# Patient Record
Sex: Male | Born: 1955 | Race: Black or African American | Hispanic: No | Marital: Single | Smoking: Never smoker
Health system: Southern US, Community
[De-identification: ages and names within clinical notes are randomized; demographics above are authoritative.]

## PROBLEM LIST (undated history)

## (undated) DIAGNOSIS — I10 Essential (primary) hypertension: Secondary | ICD-10-CM

## (undated) HISTORY — PX: OTHER SURGICAL HISTORY: SHX169

---

## 2005-11-26 ENCOUNTER — Emergency Department: Payer: Self-pay | Admitting: Emergency Medicine

## 2018-02-12 ENCOUNTER — Emergency Department: Payer: No Typology Code available for payment source

## 2018-02-12 ENCOUNTER — Observation Stay: Payer: No Typology Code available for payment source

## 2018-02-12 ENCOUNTER — Observation Stay
Admission: EM | Admit: 2018-02-12 | Discharge: 2018-02-13 | Disposition: A | Discharge status: Home / Self Care | Payer: No Typology Code available for payment source | Attending: Internal Medicine | Admitting: Internal Medicine

## 2018-02-12 ENCOUNTER — Encounter: Payer: Self-pay | Admitting: Emergency Medicine

## 2018-02-12 ENCOUNTER — Other Ambulatory Visit: Payer: Self-pay

## 2018-02-12 DIAGNOSIS — G459 Transient cerebral ischemic attack, unspecified: Secondary | ICD-10-CM

## 2018-02-12 DIAGNOSIS — R29700 NIHSS score 0: Secondary | ICD-10-CM | POA: Insufficient documentation

## 2018-02-12 DIAGNOSIS — Z7982 Long term (current) use of aspirin: Secondary | ICD-10-CM | POA: Insufficient documentation

## 2018-02-12 DIAGNOSIS — D170 Benign lipomatous neoplasm of skin and subcutaneous tissue of head, face and neck: Secondary | ICD-10-CM | POA: Diagnosis not present

## 2018-02-12 DIAGNOSIS — Z79899 Other long term (current) drug therapy: Secondary | ICD-10-CM | POA: Insufficient documentation

## 2018-02-12 DIAGNOSIS — Z9119 Patient's noncompliance with other medical treatment and regimen: Secondary | ICD-10-CM | POA: Insufficient documentation

## 2018-02-12 DIAGNOSIS — I6523 Occlusion and stenosis of bilateral carotid arteries: Secondary | ICD-10-CM | POA: Diagnosis not present

## 2018-02-12 DIAGNOSIS — I10 Essential (primary) hypertension: Secondary | ICD-10-CM | POA: Insufficient documentation

## 2018-02-12 DIAGNOSIS — R202 Paresthesia of skin: Secondary | ICD-10-CM | POA: Diagnosis present

## 2018-02-12 DIAGNOSIS — Z7902 Long term (current) use of antithrombotics/antiplatelets: Secondary | ICD-10-CM | POA: Insufficient documentation

## 2018-02-12 DIAGNOSIS — I639 Cerebral infarction, unspecified: Secondary | ICD-10-CM | POA: Diagnosis not present

## 2018-02-12 HISTORY — DX: Essential (primary) hypertension: I10

## 2018-02-12 LAB — DIFFERENTIAL
Basophils Absolute: 0 10*3/uL (ref 0–0.1)
Basophils Relative: 1 %
EOS ABS: 0.2 10*3/uL (ref 0–0.7)
EOS PCT: 2 %
Lymphocytes Relative: 25 %
Lymphs Abs: 1.8 10*3/uL (ref 1.0–3.6)
Monocytes Absolute: 0.6 10*3/uL (ref 0.2–1.0)
Monocytes Relative: 8 %
Neutro Abs: 4.8 10*3/uL (ref 1.4–6.5)
Neutrophils Relative %: 64 %

## 2018-02-12 LAB — CBC
HCT: 47.6 % (ref 40.0–52.0)
Hemoglobin: 16.2 g/dL (ref 13.0–18.0)
MCH: 30.6 pg (ref 26.0–34.0)
MCHC: 34.1 g/dL (ref 32.0–36.0)
MCV: 89.9 fL (ref 80.0–100.0)
PLATELETS: 164 10*3/uL (ref 150–440)
RBC: 5.3 MIL/uL (ref 4.40–5.90)
RDW: 12.9 % (ref 11.5–14.5)
WBC: 7.5 10*3/uL (ref 3.8–10.6)

## 2018-02-12 LAB — COMPREHENSIVE METABOLIC PANEL
ALT: 28 U/L (ref 0–44)
ANION GAP: 8 (ref 5–15)
AST: 32 U/L (ref 15–41)
Albumin: 4.6 g/dL (ref 3.5–5.0)
Alkaline Phosphatase: 69 U/L (ref 38–126)
BUN: 14 mg/dL (ref 8–23)
CHLORIDE: 103 mmol/L (ref 98–111)
CO2: 30 mmol/L (ref 22–32)
Calcium: 9.4 mg/dL (ref 8.9–10.3)
Creatinine, Ser: 1.13 mg/dL (ref 0.61–1.24)
GFR calc Af Amer: >60 mL/min (ref >60)
GFR calc non Af Amer: >60 mL/min (ref >60)
GLUCOSE: 115 mg/dL — AB (ref 70–99)
POTASSIUM: 3.9 mmol/L (ref 3.5–5.1)
SODIUM: 141 mmol/L (ref 135–145)
TOTAL PROTEIN: 8.4 g/dL — AB (ref 6.5–8.1)
Total Bilirubin: 1.6 mg/dL — ABNORMAL HIGH (ref 0.3–1.2)

## 2018-02-12 LAB — PROTIME-INR
INR: 0.98
Prothrombin Time: 12.9 seconds (ref 11.4–15.2)

## 2018-02-12 LAB — APTT: aPTT: 31 seconds (ref 24–36)

## 2018-02-12 LAB — GLUCOSE, CAPILLARY: Glucose-Capillary: 98 mg/dL (ref 70–99)

## 2018-02-12 LAB — TROPONIN I: Troponin I: <0.03 ng/mL (ref <0.03)

## 2018-02-12 MED ORDER — ACETAMINOPHEN 650 MG RE SUPP: 650.0000 mg | RECTAL | Status: DC | PRN

## 2018-02-12 MED ORDER — ASPIRIN 300 MG RE SUPP: 300.0000 mg | Freq: Every day | RECTAL | Status: DC

## 2018-02-12 MED ORDER — ACETAMINOPHEN 325 MG PO TABS: 650.0000 mg | ORAL_TABLET | ORAL | Status: DC | PRN

## 2018-02-12 MED ORDER — STROKE: EARLY STAGES OF RECOVERY BOOK
Freq: Once | Status: AC
  Administered 2018-02-12: 15:00:00

## 2018-02-12 MED ORDER — ASPIRIN 325 MG PO TABS
325.0000 mg | ORAL_TABLET | Freq: Every day | ORAL | Status: DC
  Administered 2018-02-12 – 2018-02-13 (×2): 325 mg via ORAL
  Filled 2018-02-12 (×2): qty 1

## 2018-02-12 MED ORDER — ENOXAPARIN SODIUM 40 MG/0.4ML ~~LOC~~ SOLN
40.0000 mg | SUBCUTANEOUS | Status: DC
Start: 2018-02-12 — End: 2018-02-13
  Administered 2018-02-12: 40 mg via SUBCUTANEOUS
  Filled 2018-02-12: qty 0.4

## 2018-02-12 MED ORDER — ACETAMINOPHEN 160 MG/5ML PO SOLN
650.0000 mg | ORAL | Status: DC | PRN
  Filled 2018-02-12: qty 20.3

## 2018-02-12 NOTE — ED Notes (Signed)
Labs sent at this time with CODE STROKE label in the bag. RN will monitor.

## 2018-02-12 NOTE — ED Notes (Signed)
Report attempted at this time. RN will monitor. 

## 2018-02-12 NOTE — Progress Notes (Signed)
CODE STROKE- PHARMACY COMMUNICATION   Time CODE STROKE called/page received:1015  Time response to CODE STROKE was made (in person or via phone): 1015  Time Stroke Kit retrieved from [place] (only if needed):  Name of Provider/Nurse contacted:Cassandra  Past Medical History:  Diagnosis Date  . Hypertension    Prior to Admission medications   Not on File    Napoleon Form ,PharmD Clinical Pharmacist  02/12/2018  1:16 PM

## 2018-02-12 NOTE — ED Triage Notes (Signed)
Numbness in right arm and right leg for 1 hour.  No arm drift or difficulty walking.  Face is symetrical.

## 2018-02-12 NOTE — Progress Notes (Signed)
Advanced care plan. Purpose of the Encounter: CODE STATUS Parties in Attendance:Patient Patient's Decision Capacity:Good Subjective/Patient's story: Presented with right hand and right foot numbness Objective/Medical story Patient being admitted for stroke work-up Goals of care determination:  Advance care directives and goals of care discussed For now patient wants everything done which includes CPR and intubation and ventilator CODE STATUS: Full code Time spent discussing advanced care planning: 16 minutes

## 2018-02-12 NOTE — ED Notes (Signed)
Per verbal order from admitting MD no swallow screen needed and a 2g Na+ diet ordered.

## 2018-02-12 NOTE — ED Notes (Signed)
Code Stroke called at [phone]

## 2018-02-12 NOTE — Code Documentation (Signed)
PT arrives via POV with complaints of right foot and hand numbness, states at 0915 while mowing he developed numbness, Code stroke activated upon arrival to ED, Cleared for CT at 1034, after CT pt taken to room 10, initial NIHSS 0, Dr. Doy Mince at bedside states numbness is resolving, no tPA due to improving symptoms, pt states hx of HTN but has not been taking his BP meds for 1 month, report off to [facility]

## 2018-02-12 NOTE — Consult Note (Signed)
Referring Physician: Archie Balboa    Chief Complaint: Right sided numbness  HPI: Sean Rodgers is an 62 y.o. male who reports that he was mowing grass this morning and had the acute onset of right foot paresthesias. Numbness then traveled to upper arm and hand on the right.  Patient presented for evaluation at that time. Patient has not been taking his antihypertensives for about a month.   Initial NIHSS of 0.  Date last known well: Date: 02/12/2018 Time last known well: Time: 09:15 tPA Given: No: Resolution of symptoms  Past Medical History:  Diagnosis Date  . Hypertension     History reviewed. No pertinent surgical history.  Family history: Not available.    Social History:  reports that he has never smoked. He has never used smokeless tobacco. He reports that he drinks alcohol. His drug history is not on file.  Allergies: Allergies not on file  Medications: I have reviewed the patient's current medications. Prior to Admission:  Prior to Admission medications   Not on File     ROS: History obtained from the patient  General ROS: negative for - chills, fatigue, fever, night sweats, weight gain or weight loss Psychological ROS: negative for - behavioral disorder, hallucinations, memory difficulties, mood swings or suicidal ideation Ophthalmic ROS: negative for - blurry vision, double vision, eye pain or loss of vision ENT ROS: negative for - epistaxis, nasal discharge, oral lesions, sore throat, tinnitus or vertigo Allergy and Immunology ROS: negative for - hives or itchy/watery eyes Hematological and Lymphatic ROS: negative for - bleeding problems, bruising or swollen lymph nodes Endocrine ROS: negative for - galactorrhea, hair pattern changes, polydipsia/polyuria or temperature intolerance Respiratory ROS: negative for - cough, hemoptysis, shortness of breath or wheezing Cardiovascular ROS: negative for - chest pain, dyspnea on exertion, edema or irregular  heartbeat Gastrointestinal ROS: negative for - abdominal pain, diarrhea, hematemesis, nausea/vomiting or stool incontinence Genito-Urinary ROS: negative for - dysuria, hematuria, incontinence or urinary frequency/urgency Musculoskeletal ROS: negative for - joint swelling or muscular weakness Neurological ROS: as noted in HPI Dermatological ROS: negative for rash and skin lesion changes  Physical Examination: Height 5\' 11"  (1.803 m), weight 79.4 kg (175 lb).  HEENT-  Normocephalic, no lesions, without obvious abnormality.  Normal external eye and conjunctiva.  Normal TM's bilaterally.  Normal auditory canals and external ears. Normal external nose, mucus membranes and septum.  Normal pharynx. Cardiovascular- S1, S2 normal, pulses palpable throughout   Lungs- chest clear, no wheezing, rales, normal symmetric air entry Abdomen- soft, non-tender; bowel sounds normal; no masses,  no organomegaly Extremities- no edema Lymph-no adenopathy palpable Musculoskeletal-no joint tenderness, deformity or swelling Skin-warm and dry, no hyperpigmentation, vitiligo, or suspicious lesions  Neurological Examination   Mental Status: Alert, oriented, thought content appropriate.  Speech fluent without evidence of aphasia.  Able to follow 3 step commands without difficulty. Cranial Nerves: II: Discs flat bilaterally; Visual fields grossly normal, pupils equal, round, reactive to light and accommodation III,IV, VI: ptosis not present, extra-ocular motions intact bilaterally V,VII: smile symmetric, facial light touch sensation normal bilaterally VIII: hearing normal bilaterally IX,X: gag reflex present XI: bilateral shoulder shrug XII: midline tongue extension Motor: Right : Upper extremity   5/5    Left:     Upper extremity   5/5  Lower extremity   5/5     Lower extremity   5/5 Tone and bulk:normal tone throughout; no atrophy noted Sensory: Pinprick and light touch intact throughout, bilaterally Deep  Tendon Reflexes: 2+ and  symmetric throughout Plantars: Right: downgoing   Left: downgoing Cerebellar: Normal finger-to-nose and normal heel-to-shin testing bilaterally Gait: not tested due to safety concerns   Laboratory Studies:  Basic Metabolic Panel: No results for input(s): NA, K, CL, CO2, GLUCOSE, BUN, CREATININE, CALCIUM, MG, PHOS in the last 168 hours.  Liver Function Tests: No results for input(s): AST, ALT, ALKPHOS, BILITOT, PROT, ALBUMIN in the last 168 hours. No results for input(s): LIPASE, AMYLASE in the last 168 hours. No results for input(s): AMMONIA in the last 168 hours.  CBC: No results for input(s): WBC, NEUTROABS, HGB, HCT, MCV, PLT in the last 168 hours.  Cardiac Enzymes: No results for input(s): CKTOTAL, CKMB, CKMBINDEX, TROPONINI in the last 168 hours.  BNP: Invalid input(s): POCBNP  CBG: Recent Labs  Lab 02/12/18 1022  GLUCAP 98    Microbiology: No results found for this or any previous visit.  Coagulation Studies: No results for input(s): LABPROT, INR in the last 72 hours.  Urinalysis: No results for input(s): COLORURINE, LABSPEC, PHURINE, GLUCOSEU, HGBUR, BILIRUBINUR, KETONESUR, PROTEINUR, UROBILINOGEN, NITRITE, LEUKOCYTESUR in the last 168 hours.  Invalid input(s): APPERANCEUR  Lipid Panel: No results found for: CHOL, TRIG, HDL, CHOLHDL, VLDL, LDLCALC  HgbA1C: No results found for: HGBA1C  Urine Drug Screen:  No results found for: LABOPIA, COCAINSCRNUR, LABBENZ, AMPHETMU, THCU, LABBARB  Alcohol Level: No results for input(s): ETH in the last 168 hours.  Other results: EKG: sinus tachycardia.  Imaging: Ct Head Code Stroke Wo Contrast  Result Date: 02/12/2018 CLINICAL DATA:  Code stroke. Numbness in the right arm and leg for 1 hour. Stroke suspected EXAM: CT HEAD WITHOUT CONTRAST TECHNIQUE: Contiguous axial images were obtained from the base of the skull through the vertex without intravenous contrast. COMPARISON:  None. FINDINGS:  Brain: No evidence of acute infarction, hemorrhage, hydrocephalus, extra-axial collection or mass lesion/mass effect. Vascular: No hyperdense vessel.  Atherosclerotic calcification. Skull: Negative Sinuses/Orbits: Negative Other: These results were called by telephone at the time of interpretation on 02/12/2018 at 10:43 am to Dr. Conni Slipper , who verbally acknowledged these results. ASPECTS [facility] Stroke Program Early CT Score) - Ganglionic level infarction (caudate, lentiform nuclei, internal capsule, insula, M1-M3 cortex): 7 - Supraganglionic infarction (M4-M6 cortex): 3 Total score (0-10 with 10 being normal): 10 IMPRESSION: 1. No acute finding.ASPECTS is 10. 2. Atherosclerotic calcification. Electronically Signed   By: Monte Fantasia M.D.   On: 02/12/2018 10:44    Assessment: 62 y.o. male with a history of HTN, not compliant with medications who presented with right sided numbness.  Patient has had resolution of symptoms.  Head CT reviewed and shows no acute changes.  Can not rule out TIA.  Further work up recommended.    Stroke Risk Factors - hypertension  Plan: 1. HgbA1c, fasting lipid panel 2. MRI, MRA  of the brain without contrast 3. PT consult, OT consult, Speech consult 4. Echocardiogram 5. Carotid dopplers 6. Prophylactic therapy-Antiplatelet med: Aspirin - dose 325mg  daly 7. NPO until RN stroke swallow screen 8. Telemetry monitoring 9. Frequent neuro checks 10. BP control  Case discussed with Dr. Erma Heritage, MD Neurology [phone] 02/12/2018, 10:46 AM

## 2018-02-12 NOTE — ED Provider Notes (Signed)
[facility] Emergency Department Provider Note   ____________________________________________   I have reviewed the triage vital signs and the nursing notes.   HISTORY  Chief Complaint Numbness   History limited by: Not Limited   HPI Sean Rodgers is a 62 y.o. male who presents to the emergency department today because of concerns for right hand and right foot numbness.  The symptoms started today.  He states he was mowing the lawn when he first noticed it.  He tried to see if they would go away however when they persisted he presented to the emergency department.  Denies similar symptoms in the past.  Denies any associated headache.  No chest pain or shortness of breath.  The patient states that the time my exam that his symptoms are improving.  Feels like his foot is better and his right hand is also improved.    Per medical record review patient has a history of hypertension  Past Medical History:  Diagnosis Date  . Hypertension     There are no active problems to display for this patient.   History reviewed. No pertinent surgical history.  Prior to Admission medications   Not on File    Allergies Patient has no known allergies.  No family history on file.  Social History Social History   Tobacco Use  . Smoking status: Never Smoker  . Smokeless tobacco: Never Used  Substance Use Topics  . Alcohol use: Yes  . Drug use: Not on file    Review of Systems Constitutional: No fever/chills Eyes: No visual changes. ENT: No sore throat. Cardiovascular: Denies chest pain. Respiratory: Denies shortness of breath. Gastrointestinal: No abdominal pain.  No nausea, no vomiting.  No diarrhea.   Genitourinary: Negative for dysuria. Musculoskeletal: Negative for back pain. Skin: Negative for rash. Neurological: Positive for right hand and right foot numbness  ____________________________________________   PHYSICAL EXAM:  VITAL  SIGNS: ED Triage Vitals  Enc Vitals Group     BP --      Pulse --      Resp --      Temp --      Temp src --      SpO2 --      Weight 02/12/18 1021 175 lb (79.4 kg)     Height 02/12/18 1021 5\' 11"  (1.803 m)     Head Circumference --      Peak Flow --      Pain Score 02/12/18 1020 0   Constitutional: Alert and oriented.  Eyes: Conjunctivae are normal.  ENT      Head: Normocephalic and atraumatic.      Nose: No congestion/rhinnorhea.      Mouth/Throat: Mucous membranes are moist.      Neck: No stridor. Hematological/Lymphatic/Immunilogical: No cervical lymphadenopathy. Cardiovascular: Normal rate, regular rhythm.  No murmurs, rubs, or gallops.  Respiratory: Normal respiratory effort without tachypnea nor retractions. Breath sounds are clear and equal bilaterally. No wheezes/rales/rhonchi. Gastrointestinal: Soft and non tender. No rebound. No guarding.  Genitourinary: Deferred Musculoskeletal: Normal range of motion in all extremities. No lower extremity edema. Neurologic:  Normal speech and language. No gross focal neurologic deficits are appreciated.  Skin:  Skin is warm, dry and intact. No rash noted. Psychiatric: Mood and affect are normal. Speech and behavior are normal. Patient exhibits appropriate insight and judgment.  ____________________________________________    LABS (pertinent positives/negatives)  INR 0.98 CBC wbc 7.5, hgb 16.2, plt 164 CMP na 141, k 3.9, glu 115, cr  1.13 Trop <0.03 ____________________________________________   EKG  INance Pear, attending physician, personally viewed and interpreted this EKG  EKG Time: 1040 Rate: 101 Rhythm: sinus tachycardia Axis: normal Intervals: qtc 493 QRS: narrow ST changes: no st elevation Impression: abnormal ekg  ____________________________________________    RADIOLOGY  CT head.  No acute  findings  ____________________________________________   PROCEDURES  Procedures  ____________________________________________   INITIAL IMPRESSION / ASSESSMENT AND PLAN / ED COURSE  Pertinent labs & imaging results that were available during my care of the patient were reviewed by me and considered in my medical decision making (see chart for details).   Patient presented to the emergency department today because of concerns for right hand and right foot numbness.  The time my exam symptoms had been improving.  Patient was evaluated by neurology.  They do not feel he is a candidate for TPA at this time.  Do recommend admission for further stroke work-up.  ____________________________________________   FINAL CLINICAL IMPRESSION(S) / ED DIAGNOSES  Final diagnoses:  Paresthesias     Note: This dictation was prepared with Dragon dictation. Any transcriptional errors that result from this process are unintentional     Nance Pear, MD 02/12/18 1214

## 2018-02-12 NOTE — Progress Notes (Signed)
   02/12/18 1030  Clinical Encounter Type  Visited With Patient;Other (Comment)  Visit Type Initial;Spiritual support;ED  Referral From Nurse  Consult/Referral To Chaplain  Spiritual Encounters  Spiritual Needs Prayer;Emotional   Sean Rodgers received a code stroke page for ED10. [place] entered the patient's room and patient was concerned because of [place] visit. Patient was reassured as [place] began pastoral support and active listening skills. Dr. Doy Mince recommended that the patient stay for more test to be completed and the patient concurred. CH went to waiting area and found the patient's friend who had brought the patient to the ED. Friend was escorted to the patient's room and [place] practiced the ministry of encouragement. [place] will follow up once patient is transferred to the receiving unit.

## 2018-02-12 NOTE — H&P (Addendum)
[name] NAME: Sean Rodgers    MR#:  [id number]  DATE OF BIRTH:  Apr 01, 1956  DATE OF ADMISSION:  02/12/2018  PRIMARY CARE PHYSICIAN: No primary care provider on file.   REQUESTING/REFERRING PHYSICIAN:   CHIEF COMPLAINT:   Chief Complaint  Patient presents with  . Numbness    HISTORY OF PRESENT ILLNESS: Sean Rodgers  is a 62 y.o. male with a known history of high blood pressure not on any medication presented to the emergency room with numbness in the right hand and right foot.  The numbness started this morning.  Patient was mowing the lawn and works for [employer].  No complaints of any slurred speech, weakness in any part of the body.  No history of any seizures.  Patient was worked up with CT head which showed no acute abnormality.  Hospitalist service was consulted.  Neurology consultation was also done and was recommended echocardiogram, carotid ultrasound and work-up with MRI brain.  PAST MEDICAL HISTORY:   Past Medical History:  Diagnosis Date  . Hypertension     PAST SURGICAL HISTORY:  Past Surgical History:  Procedure Laterality Date  . none      SOCIAL HISTORY:  Social History   Tobacco Use  . Smoking status: Never Smoker  . Smokeless tobacco: Never Used  Substance Use Topics  . Alcohol use: Yes    FAMILY HISTORY: No family history on file.  DRUG ALLERGIES: No Known Allergies  REVIEW OF SYSTEMS:   CONSTITUTIONAL: No fever, fatigue or weakness.  EYES: No blurred or double vision.  EARS, NOSE, AND THROAT: No tinnitus or ear pain.  RESPIRATORY: No cough, shortness of breath, wheezing or hemoptysis.  CARDIOVASCULAR: No chest pain, orthopnea, edema.  GASTROINTESTINAL: No nausea, vomiting, diarrhea or abdominal pain.  GENITOURINARY: No dysuria, hematuria.  ENDOCRINE: No polyuria, nocturia,  HEMATOLOGY: No anemia, easy bruising or bleeding SKIN: No rash or  lesion. MUSCULOSKELETAL: No joint pain or arthritis.   NEUROLOGIC: No tingling, numbness right hand and foot,  No weakness.  PSYCHIATRY: No anxiety or depression.   MEDICATIONS AT HOME:  Prior to Admission medications   Not on File      PHYSICAL EXAMINATION:   VITAL SIGNS: Blood pressure (!) 161/101, resp. rate 16, height 5\' 11"  (1.803 m), weight 79.4 kg (175 lb).  GENERAL:  62 y.o.-year-old patient lying in the bed with no acute distress.  EYES: Pupils equal, round, reactive to light and accommodation. No scleral icterus. Extraocular muscles intact.  HEENT: Head atraumatic, normocephalic. Oropharynx and nasopharynx clear.  NECK:  Supple, no jugular venous distention. No thyroid enlargement, no tenderness.  LUNGS: Normal breath sounds bilaterally, no wheezing, rales,rhonchi or crepitation. No use of accessory muscles of respiration.  CARDIOVASCULAR: S1, S2 normal. No murmurs, rubs, or gallops.  ABDOMEN: Soft, nontender, nondistended. Bowel sounds present. No organomegaly or mass.  EXTREMITIES: No pedal edema, cyanosis, or clubbing.  Has right arm and foot numbness NEUROLOGIC: Cranial nerves II through XII are intact. Muscle strength 5/5 in all extremities. Sensation intact. Gait not checked.  PSYCHIATRIC: The patient is alert and oriented x 3.  SKIN: No obvious rash, lesion, or ulcer.   LABORATORY PANEL:   CBC Recent Labs  Lab 02/12/18 1046  WBC 7.5  HGB 16.2  HCT 47.6  PLT 164  MCV 89.9  MCH 30.6  MCHC 34.1  RDW 12.9  LYMPHSABS 1.8  MONOABS 0.6  EOSABS 0.2  BASOSABS 0.0   ------------------------------------------------------------------------------------------------------------------  Chemistries  Recent Labs  Lab 02/12/18 1046  NA 141  K 3.9  CL 103  CO2 30  GLUCOSE 115*  BUN 14  CREATININE 1.13  CALCIUM 9.4  AST 32  ALT 28  ALKPHOS 69  BILITOT 1.6*    ------------------------------------------------------------------------------------------------------------------ estimated creatinine clearance is 73.1 mL/min (by C-G formula based on SCr of 1.13 mg/dL). ------------------------------------------------------------------------------------------------------------------ No results for input(s): TSH, T4TOTAL, T3FREE, THYROIDAB in the last 72 hours.  Invalid input(s): FREET3   Coagulation profile Recent Labs  Lab 02/12/18 1046  INR 0.98   ------------------------------------------------------------------------------------------------------------------- No results for input(s): DDIMER in the last 72 hours. -------------------------------------------------------------------------------------------------------------------  Cardiac Enzymes Recent Labs  Lab 02/12/18 1046  TROPONINI <0.03   ------------------------------------------------------------------------------------------------------------------ Invalid input(s): POCBNP  ---------------------------------------------------------------------------------------------------------------  Urinalysis No results found for: COLORURINE, APPEARANCEUR, LABSPEC, PHURINE, GLUCOSEU, HGBUR, BILIRUBINUR, KETONESUR, PROTEINUR, UROBILINOGEN, NITRITE, LEUKOCYTESUR   RADIOLOGY: Ct Head Code Stroke Wo Contrast  Result Date: 02/12/2018 CLINICAL DATA:  Code stroke. Numbness in the right arm and leg for 1 hour. Stroke suspected EXAM: CT HEAD WITHOUT CONTRAST TECHNIQUE: Contiguous axial images were obtained from the base of the skull through the vertex without intravenous contrast. COMPARISON:  None. FINDINGS: Brain: No evidence of acute infarction, hemorrhage, hydrocephalus, extra-axial collection or mass lesion/mass effect. Vascular: No hyperdense vessel.  Atherosclerotic calcification. Skull: Negative Sinuses/Orbits: Negative Other: These results were called by telephone at the time of interpretation  on 02/12/2018 at 10:43 am to Dr. Conni Slipper , who verbally acknowledged these results. ASPECTS [facility] Stroke Program Early CT Score) - Ganglionic level infarction (caudate, lentiform nuclei, internal capsule, insula, M1-M3 cortex): 7 - Supraganglionic infarction (M4-M6 cortex): 3 Total score (0-10 with 10 being normal): 10 IMPRESSION: 1. No acute finding.ASPECTS is 10. 2. Atherosclerotic calcification. Electronically Signed   By: Monte Fantasia M.D.   On: 02/12/2018 10:44    EKG: Orders placed or performed during the hospital encounter of 02/12/18  . ED EKG  . ED EKG    IMPRESSION AND PLAN:  62 year old male patient with history of high blood pressure presented to the emergency room with numbness in the right foot and hand  -Transient ischemic attack Admit patient to medical floor observation bed Work-up for CVA with carotid ultrasound, echocardiogram and MRI brain Start patient on oral aspirin Patient able to swallow solids and liquids with out difficulty Neurology consultation  -Hypertension Permissive high blood pressure in view of stroke  -Physical therapy and occupational therapy evaluation  -DVT prophylaxis with subcu Lovenox daily  All the records are reviewed and case discussed with ED provider. Management plans discussed with the patient, family and they are in agreement.  CODE STATUS:Full code    TOTAL TIME TAKING CARE OF THIS PATIENT: 52 minutes.    Saundra Shelling M.D on 02/12/2018 at 1:18 PM  Between 7am to 6pm - Pager - [phone]  After 6pm go to [email or web address] - password [email or web address]  [facility]  Office  [phone]  CC: Primary care physician; No primary care provider on file.

## 2018-02-12 NOTE — Progress Notes (Signed)
OT Cancellation Note  Patient Details Name: Neri Samek MRN: [id number] DOB: [birth year]   Cancelled Treatment:    Reason Eval/Treat Not Completed: Patient at procedure or test/ unavailable. Order received, chart reviewed. Transport taking pt out of the room for diagnostic testing. Will re-attempt OT evaluation at later date/time as pt is available and medically appropriate.  Jeni Salles, MPH, MS, OTR/L ascom [phone] 02/12/18, 3:41 PM

## 2018-02-13 ENCOUNTER — Observation Stay (HOSPITAL_BASED_OUTPATIENT_CLINIC_OR_DEPARTMENT_OTHER)
Admit: 2018-02-13 | Discharge: 2018-02-13 | Disposition: A | Discharge status: Home / Self Care | Payer: No Typology Code available for payment source | Attending: Internal Medicine | Admitting: Internal Medicine

## 2018-02-13 DIAGNOSIS — G459 Transient cerebral ischemic attack, unspecified: Secondary | ICD-10-CM

## 2018-02-13 DIAGNOSIS — I639 Cerebral infarction, unspecified: Secondary | ICD-10-CM

## 2018-02-13 LAB — LIPID PANEL
CHOL/HDL RATIO: 2.7 ratio
CHOLESTEROL: 147 mg/dL (ref 0–200)
HDL: 55 mg/dL (ref >40)
LDL Cholesterol: 78 mg/dL (ref 0–99)
TRIGLYCERIDES: 70 mg/dL (ref <150)
VLDL: 14 mg/dL (ref 0–40)

## 2018-02-13 LAB — ECHOCARDIOGRAM COMPLETE
Height: 71 in
Weight: 2793.67 oz

## 2018-02-13 LAB — HEMOGLOBIN A1C
Hgb A1c MFr Bld: 5 % (ref 4.8–5.6)
MEAN PLASMA GLUCOSE: 96.8 mg/dL

## 2018-02-13 MED ORDER — LISINOPRIL 20 MG PO TABS
20.0000 mg | ORAL_TABLET | Freq: Every day | ORAL | Status: DC
  Administered 2018-02-13: 10:00:00 20 mg via ORAL
  Filled 2018-02-13: qty 1

## 2018-02-13 MED ORDER — ROSUVASTATIN CALCIUM 10 MG PO TABS: 10.0000 mg | ORAL_TABLET | Freq: Every day | ORAL | 0 refills | Status: AC

## 2018-02-13 MED ORDER — CLOPIDOGREL BISULFATE 75 MG PO TABS: 75.0000 mg | ORAL_TABLET | Freq: Every day | ORAL | 11 refills | Status: AC

## 2018-02-13 MED ORDER — AMLODIPINE BESYLATE 10 MG PO TABS: 10.0000 mg | ORAL_TABLET | Freq: Every day | ORAL | 0 refills | Status: AC

## 2018-02-13 MED ORDER — ASPIRIN 81 MG PO CHEW: 81.0000 mg | CHEWABLE_TABLET | Freq: Every day | ORAL | 0 refills | Status: AC

## 2018-02-13 MED ORDER — LISINOPRIL 20 MG PO TABS: 20.0000 mg | ORAL_TABLET | Freq: Every day | ORAL | 0 refills | Status: AC

## 2018-02-13 MED ORDER — ASPIRIN 325 MG PO TABS: 325.0000 mg | ORAL_TABLET | Freq: Every day | ORAL | 0 refills | Status: DC

## 2018-02-13 MED ORDER — AMLODIPINE BESYLATE 5 MG PO TABS: 5.0000 mg | ORAL_TABLET | Freq: Every day | ORAL | Status: DC

## 2018-02-13 MED ORDER — AMLODIPINE BESYLATE 10 MG PO TABS
10.0000 mg | ORAL_TABLET | Freq: Every day | ORAL | Status: DC
  Administered 2018-02-13: 10:00:00 10 mg via ORAL
  Filled 2018-02-13: qty 1

## 2018-02-13 MED ORDER — ROSUVASTATIN CALCIUM 10 MG PO TABS
10.0000 mg | ORAL_TABLET | Freq: Every day | ORAL | Status: DC
  Administered 2018-02-13: 10 mg via ORAL
  Filled 2018-02-13: qty 1

## 2018-02-13 NOTE — Discharge Summary (Signed)
[facility] NAME: Sean Rodgers    MR#:  [id number]  DATE OF BIRTH:  Aug 11, 1955  DATE OF ADMISSION:  02/12/2018 ADMITTING PHYSICIAN: Saundra Shelling, MD  DATE OF DISCHARGE: February 13, 2018  PRIMARY CARE PHYSICIAN: Patient, No Pcp Per    ADMISSION DIAGNOSIS:  Paresthesias [R20.2]  DISCHARGE DIAGNOSIS:  Active Problems:    Stroke  SECONDARY DIAGNOSIS:   Past Medical History:  Diagnosis Date  . Hypertension     HOSPITAL COURSE:   62 year old male with history of essential hypertension who has not been taking medications for several months who presented with right arm and leg numbness.  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  LDL is 78. Patient was evaluated by neurology. He underwent carotid ultrasound and echocardiogram.  MRA HEAD:  1. No emergent large vessel occlusion. Severe stenosis LEFT M2 origin. 2. Moderate to severe stenosis bilateral P2 segments. 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 3. Moderate stenosis bilateral supraclinoid ICA.   Echocardiogram showed no valvular abnormalities.  Ejection fraction was within normal limits No source of emboli.     2.  Accelerated essential hypertension due to noncompliance and acute stroke He will be discharged on lisinopril and Norvasc   Patient does not have a primary care physician.  We will arrange appointment with Dr. Edwina Barth if possible and outpatient neurology follow-up. DISCHARGE CONDITIONS AND DIET:   Stable for discharge on cardiac diet  CONSULTS OBTAINED:  Treatment Team:  Catarina Hartshorn, MD Alexis Goodell, MD  DRUG ALLERGIES:  No Known Allergies  DISCHARGE MEDICATIONS:   Allergies as of 02/13/2018   No Known Allergies     Medication List    TAKE these medications   amLODipine 10 MG tablet Commonly known as:  NORVASC Take 1 tablet (10 mg total) by mouth daily.    aspirin 81 MG chewable tablet Commonly known as:  ASPIRIN CHILDRENS Chew 1 tablet (81 mg total) by mouth daily.   clopidogrel 75 MG tablet Commonly known as:  PLAVIX Take 1 tablet (75 mg total) by mouth daily.   lisinopril 20 MG tablet Commonly known as:  PRINIVIL,ZESTRIL Take 1 tablet (20 mg total) by mouth daily.   rosuvastatin 10 MG tablet Commonly known as:  CRESTOR Take 1 tablet (10 mg total) by mouth daily.         Today   CHIEF COMPLAINT:   Symptoms have improved.  No muscle weakness.  No speech issues or headaches.   VITAL SIGNS:  Blood pressure (!) 156/102, pulse 73, temperature 99 F (37.2 C), temperature source Oral, resp. rate 20, height 5\' 11"  (1.803 m), weight 79.2 kg (174 lb 9.7 oz), SpO2 99 %.   REVIEW OF SYSTEMS:  Review of Systems  Constitutional: Negative.  Negative for chills, fever and malaise/fatigue.  HENT: Negative.  Negative for ear discharge, ear pain, hearing loss, nosebleeds and sore throat.   Eyes: Negative.  Negative for blurred vision and pain.  Respiratory: Negative.  Negative for cough, hemoptysis, shortness of breath and wheezing.   Cardiovascular: Negative.  Negative for chest pain, palpitations and leg swelling.  Gastrointestinal: Negative.  Negative for abdominal pain, blood in stool, diarrhea, nausea and vomiting.  Genitourinary: Negative.  Negative for dysuria.  Musculoskeletal: Negative.  Negative for back pain.  Skin: Negative.   Neurological: Positive for sensory change. Negative for dizziness, tremors, speech change, focal weakness, seizures and headaches.  Endo/Heme/Allergies: Negative.  Does  not bruise/bleed easily.  Psychiatric/Behavioral: Negative.  Negative for depression, hallucinations and suicidal ideas.     PHYSICAL EXAMINATION:  GENERAL:  62 y.o.-year-old patient lying in the bed with no acute distress.  NECK:  Supple, no jugular venous distention. No thyroid enlargement, no tenderness.  LUNGS: Normal breath  sounds bilaterally, no wheezing, rales,rhonchi  No use of accessory muscles of respiration.  CARDIOVASCULAR: S1, S2 normal. No murmurs, rubs, or gallops.  ABDOMEN: Soft, non-tender, non-distended. Bowel sounds present. No organomegaly or mass.  EXTREMITIES: No pedal edema, cyanosis, or clubbing.  PSYCHIATRIC: The patient is alert and oriented x 3.  SKIN: No obvious rash, lesion, or ulcer.   DATA REVIEW:   CBC Recent Labs  Lab 02/12/18 1046  WBC 7.5  HGB 16.2  HCT 47.6  PLT 164    Chemistries  Recent Labs  Lab 02/12/18 1046  NA 141  K 3.9  CL 103  CO2 30  GLUCOSE 115*  BUN 14  CREATININE 1.13  CALCIUM 9.4  AST 32  ALT 28  ALKPHOS 69  BILITOT 1.6*    Cardiac Enzymes Recent Labs  Lab 02/12/18 1046  TROPONINI <0.03    Microbiology Results  @[email or web address] @  RADIOLOGY:  Mr Brain Wo Contrast  Result Date: 02/12/2018 CLINICAL DATA:  RIGHT extremity numbness today while mowing lawn. History of hypertension. EXAM: MRI HEAD WITHOUT CONTRAST MRA HEAD WITHOUT CONTRAST TECHNIQUE: Multiplanar, multiecho pulse sequences of the brain and surrounding structures were obtained without intravenous contrast. Angiographic images of the head were obtained using MRA technique without contrast. COMPARISON:  Carotid ultrasound April 15, 2018 and CT HEAD February 12, 2018. FINDINGS: MRI HEAD FINDINGS INTRACRANIAL CONTENTS: 5 mm reduced diffusion LEFT thalamus with low ADC values. No susceptibility artifact to suggest hemorrhage. Mild parenchymal brain volume loss. No hydrocephalus. Patchy supratentorial white matter FLAIR T2 hyperintensities. No suspicious parenchymal signal, masses, mass effect. No abnormal extra-axial fluid collections. No extra-axial masses. VASCULAR: Normal major intracranial vascular flow voids present at skull base. SKULL AND UPPER CERVICAL SPINE: No abnormal sellar expansion. No suspicious calvarial bone marrow signal. Craniocervical junction maintained. Small RIGHT  frontal scalp lipoma. SINUSES/ORBITS: The mastoid air-cells and included paranasal sinuses are well-aerated.The included ocular globes and orbital contents are non-suspicious. OTHER: None. MRA HEAD FINDINGS ANTERIOR CIRCULATION: Normal flow related enhancement of the included cervical, petrous, cavernous and supraclinoid internal carotid arteries. Moderate stenosis bilateral supraclinoid ICA. Patent anterior communicating artery. Patent anterior and middle cerebral arteries. Severe stenosis LEFT M2 origin. No large vessel occlusion, aneurysm. POSTERIOR CIRCULATION: Codominant vertebral arteries. Vertebrobasilar arteries are patent, with normal flow related enhancement of the main branch vessels. Patent posterior cerebral arteries, moderate to severe stenosis bilateral P2 segments. Tiny out patchy RIGHT P2-3 junction. No large vessel occlusion, flow limiting stenosis. ANATOMIC VARIANTS: None. Source images and MIP images were reviewed. IMPRESSION: MRI HEAD: 1. 5 mm acute LEFT thalamus infarct. 2. Mild parenchymal brain volume loss. 3. Mild-to-moderate chronic small vessel ischemic changes. MRA HEAD: 1. No emergent large vessel occlusion. Severe stenosis LEFT M2 origin. 2. Moderate to severe stenosis bilateral P2 segments. 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 3. Moderate stenosis bilateral supraclinoid ICA. 4. For constellation of above findings, recommend CTA HEAD on non emergent basis. Electronically Signed   By: Elon Alas M.D.   On: 02/12/2018 19:44   US Carotid Bilateral (at [facility] Only)  Result Date: 02/12/2018 CLINICAL DATA:  62 year old male with symptoms of transient ischemic attack EXAM: BILATERAL CAROTID DUPLEX ULTRASOUND TECHNIQUE: Pearline Cables scale  imaging, color Doppler and duplex ultrasound were performed of bilateral carotid and vertebral arteries in the neck. COMPARISON:  Head CT obtained today FINDINGS: Criteria: Quantification of carotid stenosis is based on velocity parameters that  correlate the residual internal carotid diameter with NASCET-based stenosis levels, using the diameter of the distal internal carotid lumen as the denominator for stenosis measurement. The following velocity measurements were obtained: RIGHT ICA: 64/24 cm/sec CCA: 63/14 cm/sec SYSTOLIC ICA/CCA RATIO:  0.8 ECA:  104 cm/sec LEFT ICA: 71/28 cm/sec CCA: 97/02 cm/sec SYSTOLIC ICA/CCA RATIO:  0.9 ECA:  104 cm/sec RIGHT CAROTID ARTERY: Moderate focal heterogeneous atherosclerotic plaque in the proximal internal carotid artery. By peak systolic velocity criteria, the estimated stenosis remains less than 50%. RIGHT VERTEBRAL ARTERY:  Patent with normal antegrade flow. LEFT CAROTID ARTERY: Mild heterogeneous atherosclerotic plaque in the proximal internal carotid artery. By peak systolic velocity criteria, the estimated stenosis is less than 50%. LEFT VERTEBRAL ARTERY:  Patent with normal antegrade flow. IMPRESSION: 1. Mild (1-49%) stenosis proximal right internal carotid artery secondary to moderate focal heterogeneous atherosclerotic plaque. 2. Mild (1-49%) stenosis proximal left internal carotid artery secondary to mild heterogeneous atherosclerotic plaque. 3. Vertebral arteries are patent with normal antegrade flow. Signed, Criselda Peaches, MD [facility] Electronically Signed   By: Jacqulynn Cadet M.D.   On: 02/12/2018 16:42   Mr Jodene Nam Head/brain OV Cm  Result Date: 02/12/2018 CLINICAL DATA:  RIGHT extremity numbness today while mowing lawn. History of hypertension. EXAM: MRI HEAD WITHOUT CONTRAST MRA HEAD WITHOUT CONTRAST TECHNIQUE: Multiplanar, multiecho pulse sequences of the brain and surrounding structures were obtained without intravenous contrast. Angiographic images of the head were obtained using MRA technique without contrast. COMPARISON:  Carotid ultrasound April 15, 2018 and CT HEAD February 12, 2018. FINDINGS: MRI HEAD FINDINGS INTRACRANIAL  CONTENTS: 5 mm reduced diffusion LEFT thalamus with low ADC values. No susceptibility artifact to suggest hemorrhage. Mild parenchymal brain volume loss. No hydrocephalus. Patchy supratentorial white matter FLAIR T2 hyperintensities. No suspicious parenchymal signal, masses, mass effect. No abnormal extra-axial fluid collections. No extra-axial masses. VASCULAR: Normal major intracranial vascular flow voids present at skull base. SKULL AND UPPER CERVICAL SPINE: No abnormal sellar expansion. No suspicious calvarial bone marrow signal. Craniocervical junction maintained. Small RIGHT frontal scalp lipoma. SINUSES/ORBITS: The mastoid air-cells and included paranasal sinuses are well-aerated.The included ocular globes and orbital contents are non-suspicious. OTHER: None. MRA HEAD FINDINGS ANTERIOR CIRCULATION: Normal flow related enhancement of the included cervical, petrous, cavernous and supraclinoid internal carotid arteries. Moderate stenosis bilateral supraclinoid ICA. Patent anterior communicating artery. Patent anterior and middle cerebral arteries. Severe stenosis LEFT M2 origin. No large vessel occlusion, aneurysm. POSTERIOR CIRCULATION: Codominant vertebral arteries. Vertebrobasilar arteries are patent, with normal flow related enhancement of the main branch vessels. Patent posterior cerebral arteries, moderate to severe stenosis bilateral P2 segments. Tiny out patchy RIGHT P2-3 junction. No large vessel occlusion, flow limiting stenosis. ANATOMIC VARIANTS: None. Source images and MIP images were reviewed. IMPRESSION: MRI HEAD: 1. 5 mm acute LEFT thalamus infarct. 2. Mild parenchymal brain volume loss. 3. Mild-to-moderate chronic small vessel ischemic changes. MRA HEAD: 1. No emergent large vessel occlusion. Severe stenosis LEFT M2 origin. 2. Moderate to severe stenosis bilateral P2 segments. 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 3. Moderate stenosis bilateral supraclinoid ICA. 4. For constellation of  above findings, recommend CTA HEAD on non emergent basis. Electronically Signed   By: Elon Alas M.D.   On: 02/12/2018 19:44   Ct  Head Code Stroke Wo Contrast  Result Date: 02/12/2018 CLINICAL DATA:  Code stroke. Numbness in the right arm and leg for 1 hour. Stroke suspected EXAM: CT HEAD WITHOUT CONTRAST TECHNIQUE: Contiguous axial images were obtained from the base of the skull through the vertex without intravenous contrast. COMPARISON:  None. FINDINGS: Brain: No evidence of acute infarction, hemorrhage, hydrocephalus, extra-axial collection or mass lesion/mass effect. Vascular: No hyperdense vessel.  Atherosclerotic calcification. Skull: Negative Sinuses/Orbits: Negative Other: These results were called by telephone at the time of interpretation on 02/12/2018 at 10:43 am to Dr. Conni Slipper , who verbally acknowledged these results. ASPECTS [facility] Stroke Program Early CT Score) - Ganglionic level infarction (caudate, lentiform nuclei, internal capsule, insula, M1-M3 cortex): 7 - Supraganglionic infarction (M4-M6 cortex): 3 Total score (0-10 with 10 being normal): 10 IMPRESSION: 1. No acute finding.ASPECTS is 10. 2. Atherosclerotic calcification. Electronically Signed   By: Monte Fantasia M.D.   On: 02/12/2018 10:44      Allergies as of 02/13/2018   No Known Allergies     Medication List    TAKE these medications   amLODipine 10 MG tablet Commonly known as:  NORVASC Take 1 tablet (10 mg total) by mouth daily.   aspirin 81 MG chewable tablet Commonly known as:  ASPIRIN CHILDRENS Chew 1 tablet (81 mg total) by mouth daily.   clopidogrel 75 MG tablet Commonly known as:  PLAVIX Take 1 tablet (75 mg total) by mouth daily.   lisinopril 20 MG tablet Commonly known as:  PRINIVIL,ZESTRIL Take 1 tablet (20 mg total) by mouth daily.   rosuvastatin 10 MG tablet Commonly known as:  CRESTOR Take 1 tablet (10 mg total) by mouth daily.          Management plans discussed with the  patient and he is in agreement. Stable for discharge home  Patient should follow up with neurology  CODE STATUS:     Code Status Orders  (From admission, onward)        Start     Ordered   02/12/18 1433  Full code  Continuous     02/12/18 1432    Code Status History    This patient has a current code status but no historical code status.      TOTAL TIME TAKING CARE OF THIS PATIENT: 38 minutes.    Note: This dictation was prepared with Dragon dictation along with smaller phrase technology. Any transcriptional errors that result from this process are unintentional.  Ellanore Vanhook M.D on 02/13/2018 at 10:11 AM  Between 7am to 6pm - Pager - [phone] After 6pm go to [email or web address] - password [email or web address] [facility]  Office  [phone]  CC: Primary care physician; Patient, No Pcp Per

## 2018-02-13 NOTE — Evaluation (Signed)
Physical Therapy Evaluation Patient Details Name: Sean Rodgers MRN: [id number] DOB: [birth year] Today's Date: 02/13/2018   History of Present Illness  pt presents to hospital on 02/12/18 with complaints of R upper and lower extremity numbness and tingling. Pt subsequently diagnosed with a 82mm acute L thalmus infarct. Pt has a past medical history that includes HTN as well as mild B ICA stenosis.    Clinical Impression  Pt is a pleasant 62 year old male who was admitted for a 71mm L thalmus infarct. Pt performs bed mobility, transfers, and ambulation with independence. Pt demonstrates deficits with numbness in his R hand. Pt demonstrates WNL and equal bilateral strength of both upper and lower extremities. Pt complains of numbness in his R hand however states that it does not hinder his ability to feel light touch, otherwise sensation equal bilateral and WNL. Pts coordination appears intact noted through normal finger to nose, heel to shin, and rapid alternating movements. Pt amb 200' with WNL gait pattern, no AD and close supervision without change in symptoms or LOB. Pt is very independent and active at baseline and states that he is currently functioning at PLOF. At this time PT does not recommend any PT follow up and believes pt is safe to d/c when medically appropriate. PT plans to d/c pt in house, if further needs for PT arise please indicate so through new order. This entire session was guided, instructed, and directly supervised by Greggory Stallion, DPT.      Follow Up Recommendations No PT follow up    Equipment Recommendations  None recommended by PT    Recommendations for Other Services       Precautions / Restrictions Precautions Precautions: Fall(fall precaution orders, 5 fall risk score) Restrictions Weight Bearing Restrictions: No      Mobility  Bed Mobility Overal bed mobility: Independent             General bed mobility comments: pt independent with supine to  sitting EOB  Transfers Overall transfer level: Independent Equipment used: None             General transfer comment: Independently transfers sit<>stand safely with appropriate amount of UE support and time needed  Ambulation/Gait Ambulation/Gait assistance: Independent Gait Distance (Feet): 200 Feet Assistive device: None Gait Pattern/deviations: WFL(Within Functional Limits)(slight L sided limp due to old orthopedic injury) Gait velocity: not formally measured, indicitive of community Social research officer, government Rankin (Stroke Patients Only)       Balance Overall balance assessment: Independent(stands unsupported in tandem and rhomberg stance without LOB)                                           Pertinent Vitals/Pain Pain Assessment: No/denies pain    Home Living Family/patient expects to be discharged to:: Private residence Living Arrangements: Alone   Type of Home: House Home Access: Stairs to enter Entrance Stairs-Rails: Psychiatric nurse of Steps: 3   Home Equipment: None      Prior Function Level of Independence: Independent         Comments: pt independent and active at baseline. 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 Pt states that he has old L LE injury from falling off horse which leads to him limping however does not  change his independence at all.     Hand Dominance        Extremity/Trunk Assessment   Upper Extremity Assessment Upper Extremity Assessment: Overall WFL for tasks assessed(Grossly at least 4+/5)    Lower Extremity Assessment Lower Extremity Assessment: Overall WFL for tasks assessed(Grossly at least 4+/5; 5/5 hip flexion, knee flexion/ext, DF)       Communication   Communication: No difficulties  Cognition Arousal/Alertness: Awake/alert Behavior During Therapy: WFL for tasks assessed/performed Overall Cognitive Status: Within  Functional Limits for tasks assessed                                        General Comments      Exercises     Assessment/Plan    PT Assessment Patent does not need any further PT services  PT Problem List         PT Treatment Interventions      PT Goals (Current goals can be found in the Care Plan section)  Acute Rehab PT Goals Patient Stated Goal: to get back to being healthy PT Goal Formulation: With patient Time For Goal Achievement: 02/27/18 Potential to Achieve Goals: Good    Frequency     Barriers to discharge        Co-evaluation               AM-PAC PT "6 Clicks" Daily Activity  Outcome Measure Difficulty turning over in bed (including adjusting bedclothes, sheets and blankets)?: None Difficulty moving from lying on back to sitting on the side of the bed? : None Difficulty sitting down on and standing up from a chair with arms (e.g., wheelchair, bedside commode, etc,.)?: None Help needed moving to and from a bed to chair (including a wheelchair)?: None Help needed walking in hospital room?: None Help needed climbing 3-5 steps with a railing? : None 6 Click Score: 24    End of Session Equipment Utilized During Treatment: Gait belt Activity Tolerance: Patient tolerated treatment well Patient left: in chair;with call bell/phone within reach   PT Visit Diagnosis: Other symptoms and signs involving the nervous system (E99.371)    Time: 6967-8938 PT Time Calculation (min) (ACUTE ONLY): 12 min   Charges:         PT G Codes:        Sean Rodgers, SPT   Sean Rodgers 02/13/2018, 9:50 AM

## 2018-02-13 NOTE — Plan of Care (Signed)
  Problem: Education: Goal: Knowledge of disease or condition will improve Outcome: Progressing Goal: Knowledge of secondary prevention will improve Outcome: Progressing Goal: Knowledge of patient specific risk factors addressed and post discharge goals established will improve Outcome: Progressing   Problem: Nutrition: Goal: Risk of aspiration will decrease Outcome: Progressing   Problem: Safety: Goal: Ability to remain free from injury will improve Outcome: Progressing

## 2018-02-13 NOTE — Progress Notes (Addendum)
Subjective: Patient reports continuing to have some alteration of sensation that is minimal in  His right fingertips and his right foot.    Objective: Current vital signs: BP (!) 156/102   Pulse 73   Temp 99 F (37.2 C) (Oral)   Resp 20   Ht 5\' 11"  (1.803 m)   Wt 79.2 kg (174 lb 9.7 oz)   SpO2 99%   BMI 24.35 kg/m  Vital signs in last 24 hours: Temp:  [97.8 F (36.6 C)-99 F (37.2 C)] 99 F (37.2 C) (07/23 1006) Pulse Rate:  [60-76] 73 (07/23 1006) Resp:  [16-30] 20 (07/22 1748) BP: (139-183)/(86-103) 156/102 (07/23 1006) SpO2:  [96 %-99 %] 99 % (07/23 1006) Weight:  [79.2 kg (174 lb 9.7 oz)-79.4 kg (175 lb)] 79.2 kg (174 lb 9.7 oz) (07/22 1426)  Intake/Output from previous day: 07/22 0701 - 07/23 0700 In: 240 [P.O.:240] Out: -  Intake/Output this shift: No intake/output data recorded. Nutritional status:  Diet Order           Diet 2 gram sodium Room service appropriate? Yes; Fluid consistency: Thin  Diet effective now          Neurologic Exam: Mental Status: Alert, oriented, thought content appropriate.  Speech fluent without evidence of aphasia.  Able to follow 3 step commands without difficulty. Cranial Nerves: II: Discs flat bilaterally; Visual fields grossly normal, pupils equal, round, reactive to light and accommodation III,IV, VI: ptosis not present, extra-ocular motions intact bilaterally V,VII: smile symmetric, facial light touch sensation normal bilaterally VIII: hearing normal bilaterally IX,X: gag reflex present XI: bilateral shoulder shrug XII: midline tongue extension Motor: 5/5 throughout; no atrophy noted Gait: normal  Lab Results: Basic Metabolic Panel: Recent Labs  Lab 02/12/18 1046  NA 141  K 3.9  CL 103  CO2 30  GLUCOSE 115*  BUN 14  CREATININE 1.13  CALCIUM 9.4    Liver Function Tests: Recent Labs  Lab 02/12/18 1046  AST 32  ALT 28  ALKPHOS 69  BILITOT 1.6*  PROT 8.4*  ALBUMIN 4.6   No results for input(s): LIPASE,  AMYLASE in the last 168 hours. No results for input(s): AMMONIA in the last 168 hours.  CBC: Recent Labs  Lab 02/12/18 1046  WBC 7.5  NEUTROABS 4.8  HGB 16.2  HCT 47.6  MCV 89.9  PLT 164    Cardiac Enzymes: Recent Labs  Lab 02/12/18 1046  TROPONINI <0.03    Lipid Panel: Recent Labs  Lab 02/13/18 0507  CHOL 147  TRIG 70  HDL 55  CHOLHDL 2.7  VLDL 14  LDLCALC 78    CBG: Recent Labs  Lab 02/12/18 1022  GLUCAP 98    Microbiology: No results found for this or any previous visit.  Coagulation Studies: Recent Labs    02/12/18 1046  LABPROT 12.9  INR 0.98    Imaging: Mr Brain Wo Contrast  Result Date: 02/12/2018 CLINICAL DATA:  RIGHT extremity numbness today while mowing lawn. History of hypertension. EXAM: MRI HEAD WITHOUT CONTRAST MRA HEAD WITHOUT CONTRAST TECHNIQUE: Multiplanar, multiecho pulse sequences of the brain and surrounding structures were obtained without intravenous contrast. Angiographic images of the head were obtained using MRA technique without contrast. COMPARISON:  Carotid ultrasound April 15, 2018 and CT HEAD February 12, 2018. FINDINGS: MRI HEAD FINDINGS INTRACRANIAL CONTENTS: 5 mm reduced diffusion LEFT thalamus with low ADC values. No susceptibility artifact to suggest hemorrhage. Mild parenchymal brain volume loss. No hydrocephalus. Patchy supratentorial white matter FLAIR T2 hyperintensities.  No suspicious parenchymal signal, masses, mass effect. No abnormal extra-axial fluid collections. No extra-axial masses. VASCULAR: Normal major intracranial vascular flow voids present at skull base. SKULL AND UPPER CERVICAL SPINE: No abnormal sellar expansion. No suspicious calvarial bone marrow signal. Craniocervical junction maintained. Small RIGHT frontal scalp lipoma. SINUSES/ORBITS: The mastoid air-cells and included paranasal sinuses are well-aerated.The included ocular globes and orbital contents are non-suspicious. OTHER: None. MRA HEAD FINDINGS  ANTERIOR CIRCULATION: Normal flow related enhancement of the included cervical, petrous, cavernous and supraclinoid internal carotid arteries. Moderate stenosis bilateral supraclinoid ICA. Patent anterior communicating artery. Patent anterior and middle cerebral arteries. Severe stenosis LEFT M2 origin. No large vessel occlusion, aneurysm. POSTERIOR CIRCULATION: Codominant vertebral arteries. Vertebrobasilar arteries are patent, with normal flow related enhancement of the main branch vessels. Patent posterior cerebral arteries, moderate to severe stenosis bilateral P2 segments. Tiny out patchy RIGHT P2-3 junction. No large vessel occlusion, flow limiting stenosis. ANATOMIC VARIANTS: None. Source images and MIP images were reviewed. IMPRESSION: MRI HEAD: 1. 5 mm acute LEFT thalamus infarct. 2. Mild parenchymal brain volume loss. 3. Mild-to-moderate chronic small vessel ischemic changes. MRA HEAD: 1. No emergent large vessel occlusion. Severe stenosis LEFT M2 origin. 2. Moderate to severe stenosis bilateral P2 segments. 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 3. Moderate stenosis bilateral supraclinoid ICA. 4. For constellation of above findings, recommend CTA HEAD on non emergent basis. Electronically Signed   By: Elon Alas M.D.   On: 02/12/2018 19:44   US Carotid Bilateral (at [facility] Only)  Result Date: 02/12/2018 CLINICAL DATA:  62 year old male with symptoms of transient ischemic attack EXAM: BILATERAL CAROTID DUPLEX ULTRASOUND TECHNIQUE: Pearline Cables scale imaging, color Doppler and duplex ultrasound were performed of bilateral carotid and vertebral arteries in the neck. COMPARISON:  Head CT obtained today FINDINGS: Criteria: Quantification of carotid stenosis is based on velocity parameters that correlate the residual internal carotid diameter with NASCET-based stenosis levels, using the diameter of the distal internal carotid lumen as the denominator for stenosis measurement. The following velocity  measurements were obtained: RIGHT ICA: 64/24 cm/sec CCA: 06/26 cm/sec SYSTOLIC ICA/CCA RATIO:  0.8 ECA:  104 cm/sec LEFT ICA: 71/28 cm/sec CCA: 94/85 cm/sec SYSTOLIC ICA/CCA RATIO:  0.9 ECA:  104 cm/sec RIGHT CAROTID ARTERY: Moderate focal heterogeneous atherosclerotic plaque in the proximal internal carotid artery. By peak systolic velocity criteria, the estimated stenosis remains less than 50%. RIGHT VERTEBRAL ARTERY:  Patent with normal antegrade flow. LEFT CAROTID ARTERY: Mild heterogeneous atherosclerotic plaque in the proximal internal carotid artery. By peak systolic velocity criteria, the estimated stenosis is less than 50%. LEFT VERTEBRAL ARTERY:  Patent with normal antegrade flow. IMPRESSION: 1. Mild (1-49%) stenosis proximal right internal carotid artery secondary to moderate focal heterogeneous atherosclerotic plaque. 2. Mild (1-49%) stenosis proximal left internal carotid artery secondary to mild heterogeneous atherosclerotic plaque. 3. Vertebral arteries are patent with normal antegrade flow. Signed, Criselda Peaches, MD [facility] Radiology Electronically Signed   By: Jacqulynn Cadet M.D.   On: 02/12/2018 16:42   Mr Jodene Nam Head/brain IO Cm  Result Date: 02/12/2018 CLINICAL DATA:  RIGHT extremity numbness today while mowing lawn. History of hypertension. EXAM: MRI HEAD WITHOUT CONTRAST MRA HEAD WITHOUT CONTRAST TECHNIQUE: Multiplanar, multiecho pulse sequences of the brain and surrounding structures were obtained without intravenous contrast. Angiographic images of the head were obtained using MRA technique without contrast. COMPARISON:  Carotid ultrasound April 15, 2018 and CT HEAD February 12, 2018. FINDINGS: MRI HEAD FINDINGS INTRACRANIAL CONTENTS: 5  mm reduced diffusion LEFT thalamus with low ADC values. No susceptibility artifact to suggest hemorrhage. Mild parenchymal brain volume loss. No hydrocephalus. Patchy supratentorial white matter  FLAIR T2 hyperintensities. No suspicious parenchymal signal, masses, mass effect. No abnormal extra-axial fluid collections. No extra-axial masses. VASCULAR: Normal major intracranial vascular flow voids present at skull base. SKULL AND UPPER CERVICAL SPINE: No abnormal sellar expansion. No suspicious calvarial bone marrow signal. Craniocervical junction maintained. Small RIGHT frontal scalp lipoma. SINUSES/ORBITS: The mastoid air-cells and included paranasal sinuses are well-aerated.The included ocular globes and orbital contents are non-suspicious. OTHER: None. MRA HEAD FINDINGS ANTERIOR CIRCULATION: Normal flow related enhancement of the included cervical, petrous, cavernous and supraclinoid internal carotid arteries. Moderate stenosis bilateral supraclinoid ICA. Patent anterior communicating artery. Patent anterior and middle cerebral arteries. Severe stenosis LEFT M2 origin. No large vessel occlusion, aneurysm. POSTERIOR CIRCULATION: Codominant vertebral arteries. Vertebrobasilar arteries are patent, with normal flow related enhancement of the main branch vessels. Patent posterior cerebral arteries, moderate to severe stenosis bilateral P2 segments. Tiny out patchy RIGHT P2-3 junction. No large vessel occlusion, flow limiting stenosis. ANATOMIC VARIANTS: None. Source images and MIP images were reviewed. IMPRESSION: MRI HEAD: 1. 5 mm acute LEFT thalamus infarct. 2. Mild parenchymal brain volume loss. 3. Mild-to-moderate chronic small vessel ischemic changes. MRA HEAD: 1. No emergent large vessel occlusion. Severe stenosis LEFT M2 origin. 2. Moderate to severe stenosis bilateral P2 segments. 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 3. Moderate stenosis bilateral supraclinoid ICA. 4. For constellation of above findings, recommend CTA HEAD on non emergent basis. Electronically Signed   By: Elon Alas M.D.   On: 02/12/2018 19:44   Ct Head Code Stroke Wo Contrast  Result Date: 02/12/2018 CLINICAL DATA:   Code stroke. Numbness in the right arm and leg for 1 hour. Stroke suspected EXAM: CT HEAD WITHOUT CONTRAST TECHNIQUE: Contiguous axial images were obtained from the base of the skull through the vertex without intravenous contrast. COMPARISON:  None. FINDINGS: Brain: No evidence of acute infarction, hemorrhage, hydrocephalus, extra-axial collection or mass lesion/mass effect. Vascular: No hyperdense vessel.  Atherosclerotic calcification. Skull: Negative Sinuses/Orbits: Negative Other: These results were called by telephone at the time of interpretation on 02/12/2018 at 10:43 am to Dr. Conni Slipper , who verbally acknowledged these results. ASPECTS [facility] Stroke Program Early CT Score) - Ganglionic level infarction (caudate, lentiform nuclei, internal capsule, insula, M1-M3 cortex): 7 - Supraganglionic infarction (M4-M6 cortex): 3 Total score (0-10 with 10 being normal): 10 IMPRESSION: 1. No acute finding.ASPECTS is 10. 2. Atherosclerotic calcification. Electronically Signed   By: Monte Fantasia M.D.   On: 02/12/2018 10:44    Medications:  I have reviewed the patient's current medications. Scheduled: . amLODipine  10 mg Oral Daily  . aspirin  300 mg Rectal Daily   Or  . aspirin  325 mg Oral Daily  . enoxaparin (LOVENOX) injection  40 mg Subcutaneous Q24H  . lisinopril  20 mg Oral Daily  . rosuvastatin  10 mg Oral Daily    Assessment/Plan: Patient without further neurological complaints.  BP remains elevated.  MRI of the brain reviewed and shows an acute left thalamic infarct.  Likely secondary to small vessel disease.  MRA shows severe stenosis of the the left M2 and bilateral P2 segments.  Carotid dopplers show no evidence of hemodynamically significant stenosis.  Echocardiogram pending.  A1c pending, LDL 78.  Recommendations: 1.  ASA 81mg  and Plavix 75mg  at discharge.  Will reduce to ASA monotherapy after neurology follow up.  2.  Statin for lipid management with target LDL<70. 3.  BP  control   LOS: 0 days   Alexis Goodell, MD Neurology [phone] 02/13/2018  10:06 AM

## 2018-02-13 NOTE — Progress Notes (Signed)
*  PRELIMINARY RESULTS* Echocardiogram 2D Echocardiogram has been performed.  Sean Rodgers 02/13/2018, 9:25 AM

## 2018-02-13 NOTE — Progress Notes (Signed)
SLP Cancellation Note  Patient Details Name: Sean Rodgers MRN: [id number] DOB: 02-25-1956   Cancelled treatment:       Reason Eval/Treat Not Completed: SLP screened, no needs identified, will sign off(chart reviewed; consulted NSG then met w/ pt). Pt denied any difficulty swallowing and is currently on a regular diet; tolerates swallowing pills w/ water per NSG. Pt conversed at conversational level w/out deficits noted; pt denied any speech-language deficits just the noted numbness at admission.  No further skilled ST services indicated as pt appears at his baseline. Pt agreed. NSG to reconsult if any change in status.     Orinda Kenner, MS, CCC-SLP Watson,Katherine 02/13/2018, 8:30 AM

## 2018-02-13 NOTE — Progress Notes (Signed)
OT Screen Note  Patient Details Name: Sean Rodgers MRN: [id number] DOB: Dec 21, 1955   Cancelled Treatment:    Reason Eval/Treat Not Completed: OT screened, no needs identified, will sign off. Order received, chart reviewed. Pt ambulating independently in his room upon entry. Pt reports continued sensation impairments but improving significantly since the onset of symptoms and causing him no functional deficits. Pt educated briefly in s/s of stroke and encouraged to be cautious around sources of heat/cold and risk of injury while sensation remains impaired. Pt verbalized understanding. No skilled OT needs, will sign off. Please re-consult if additional needs arise. Thank you for this consult.  Jeni Salles, MPH, MS, OTR/L ascom [phone] 02/13/18, 10:14 AM

## 2018-02-13 NOTE — Plan of Care (Signed)
  Problem: Education: Goal: Knowledge of disease or condition will improve Outcome: Progressing Goal: Knowledge of secondary prevention will improve Outcome: Progressing Goal: Knowledge of patient specific risk factors addressed and post discharge goals established will improve Outcome: Progressing   Problem: Nutrition: Goal: Risk of aspiration will decrease Outcome: Progressing   Problem: Education: Goal: Knowledge of General Education information will improve Description Including pain rating scale, medication(s)/side effects and non-pharmacologic comfort measures Outcome: Progressing   Problem: Health Behavior/Discharge Planning: Goal: Ability to manage health-related needs will improve Outcome: Progressing   Problem: Activity: Goal: Risk for activity intolerance will decrease Outcome: Progressing   Problem: Safety: Goal: Ability to remain free from injury will improve Outcome: Progressing

## 2018-02-13 NOTE — Progress Notes (Signed)
Patient discharge home,discharge instruction given to patient, patient verbalized understanding. All questions answered and concern addressed.

## 2018-02-14 LAB — HIV ANTIBODY (ROUTINE TESTING W REFLEX): HIV SCREEN 4TH GENERATION: NONREACTIVE

## 2018-05-07 ENCOUNTER — Other Ambulatory Visit: Payer: Self-pay | Admitting: Nurse Practitioner

## 2018-05-07 DIAGNOSIS — B182 Chronic viral hepatitis C: Secondary | ICD-10-CM

## 2018-06-25 ENCOUNTER — Ambulatory Visit: Payer: No Typology Code available for payment source

## 2018-06-27 ENCOUNTER — Ambulatory Visit
Admission: RE | Admit: 2018-06-27 | Discharge: 2018-06-27 | Disposition: A | Discharge status: Home / Self Care | Payer: No Typology Code available for payment source | Source: Ambulatory Visit | Attending: Nurse Practitioner | Admitting: Nurse Practitioner

## 2018-06-27 DIAGNOSIS — N281 Cyst of kidney, acquired: Secondary | ICD-10-CM | POA: Insufficient documentation

## 2018-06-27 DIAGNOSIS — K8020 Calculus of gallbladder without cholecystitis without obstruction: Secondary | ICD-10-CM | POA: Insufficient documentation

## 2018-06-27 DIAGNOSIS — K7689 Other specified diseases of liver: Secondary | ICD-10-CM | POA: Insufficient documentation

## 2018-06-27 DIAGNOSIS — B182 Chronic viral hepatitis C: Secondary | ICD-10-CM

## 2019-01-11 IMAGING — MR MR MRA HEAD W/O CM
9 of 11 series · 34 of 48 positions shown · non-contrast
Comparison: Carotid ultrasound April 15, 2018 and CT Nijah Adams

CLINICAL DATA: RIGHT extremity numbness today while mowing lawn.
History of hypertension.

EXAM:
MRI HEAD WITHOUT CONTRAST
MRA HEAD WITHOUT CONTRAST
TECHNIQUE: Multiplanar, multiecho pulse sequences of the brain and surrounding
structures were obtained without intravenous contrast. Angiographic
images of the head were obtained using MRA technique without
contrast.

[Series 2: T1 · sagittal · 5.0mm · 0.45mm/px · 2 of 23 slices shown (1 of 2)]
[im 1/23]
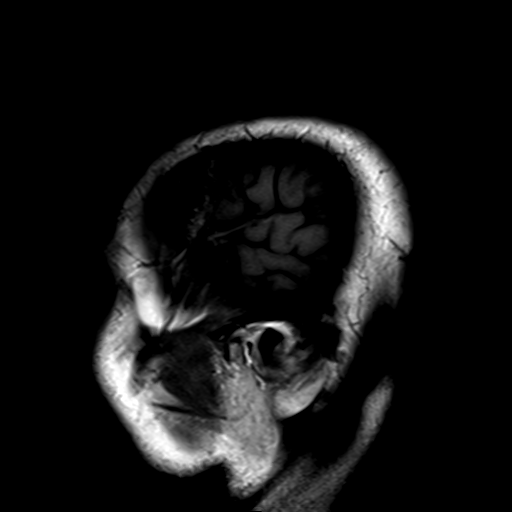
[im 23/23]
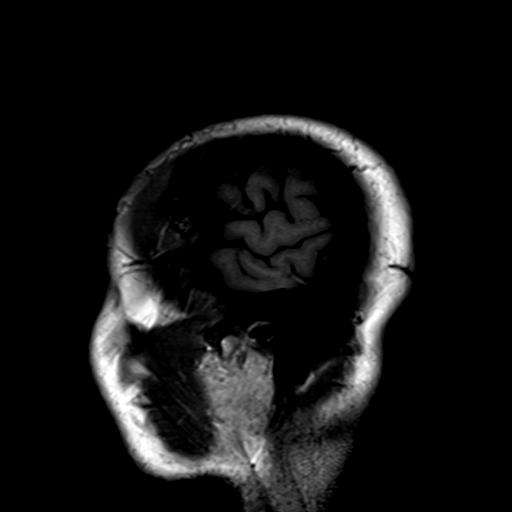

[Series 4: DWI · axial · 3.0mm · 1.80mm/px · z∈[-82,+79]mm · 4 of 55 slices shown (1 of 2)]
[im 1/55]
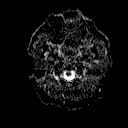
[im 19/55]
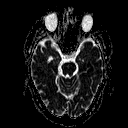
[im 37/55]
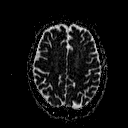
[im 55/55]
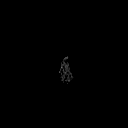

[Series 6: DWI · coronal · 3.0mm · 1.80mm/px · 3 of 47 slices shown (2 of 2)]
[im 1/47]
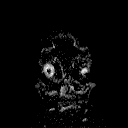
[im 24/47]
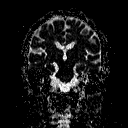
[im 47/47]
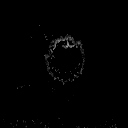

[Series 7: TOF · axial · non-contrast · 0.7mm · 0.37mm/px · z∈[-73,+13]mm · 7 of 140 slices shown]
[im 1/140]
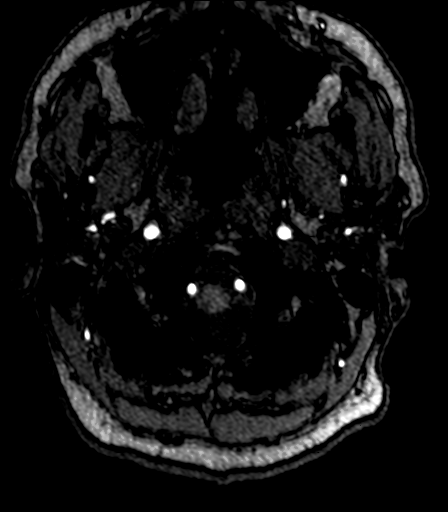
[im 16/140]
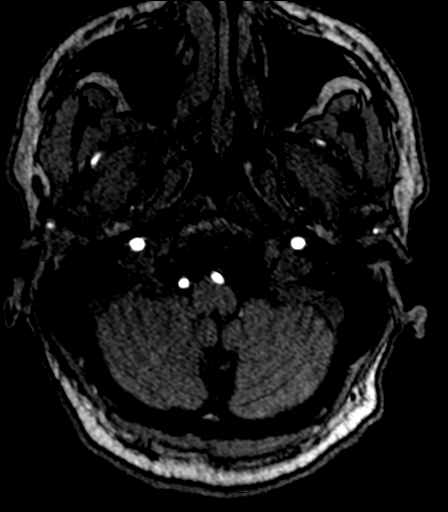
[im 47/140]
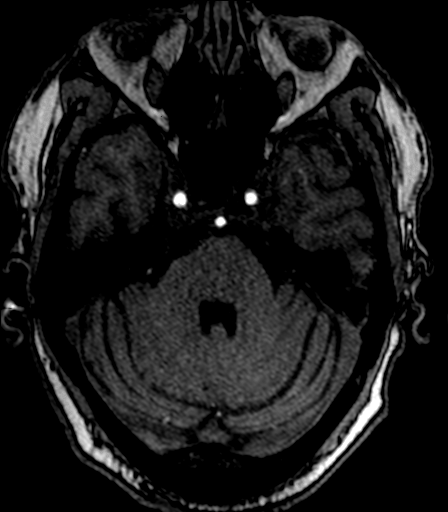
[im 62/140]
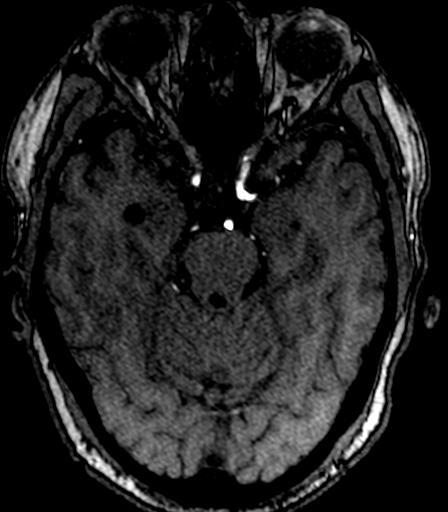
[im 78/140]
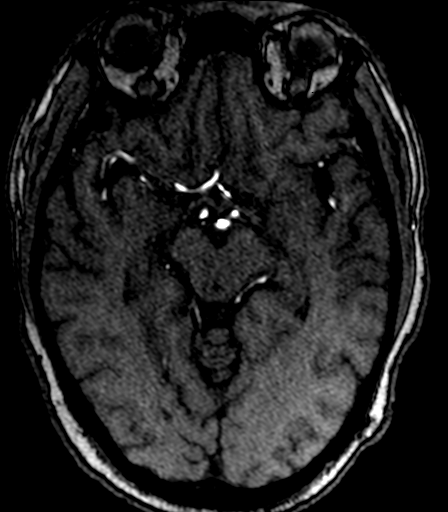
[im 93/140]
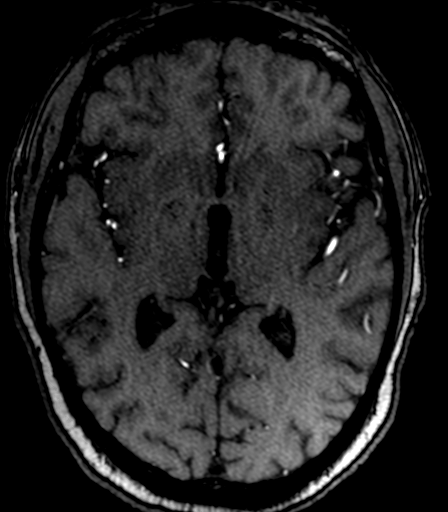
[im 124/140]
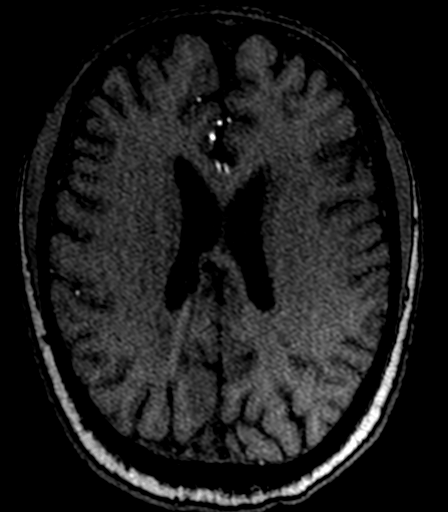

[Series 11: T2 · axial · 5.0mm · 0.60mm/px · z∈[-83,+78]mm · 2 of 26 slices shown (1 of 3)]
[im 1/26]
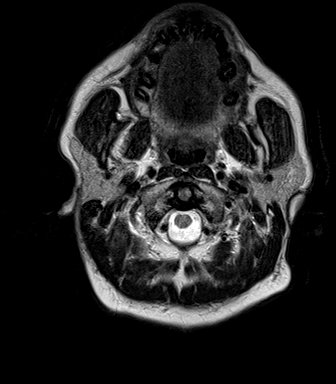
[im 26/26]
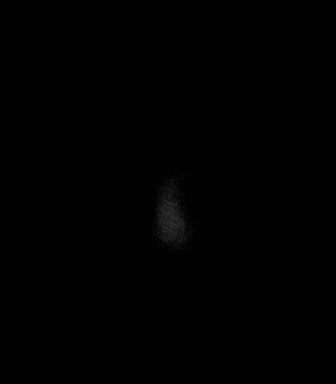

[Series 13: FLAIR · axial · 3.0mm · 0.45mm/px · z∈[-80,+75]mm · 4 of 53 slices shown]
[im 1/53]
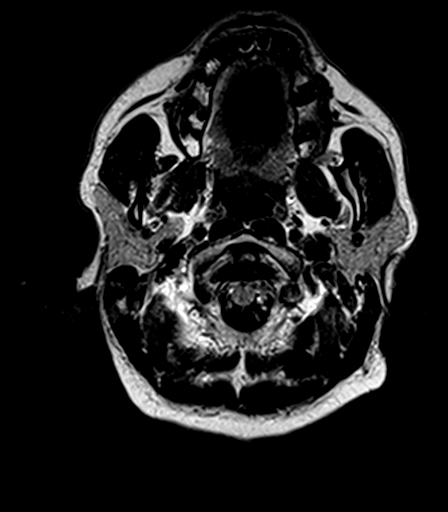
[im 18/53]
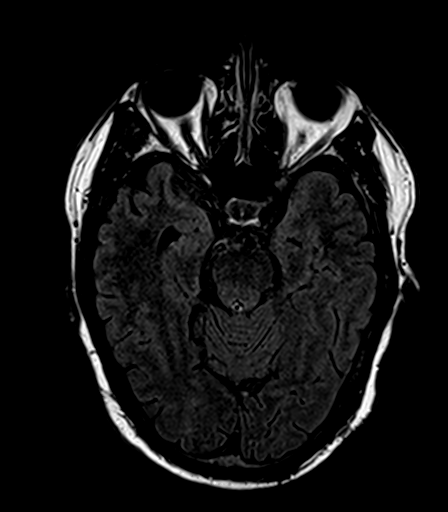
[im 35/53]
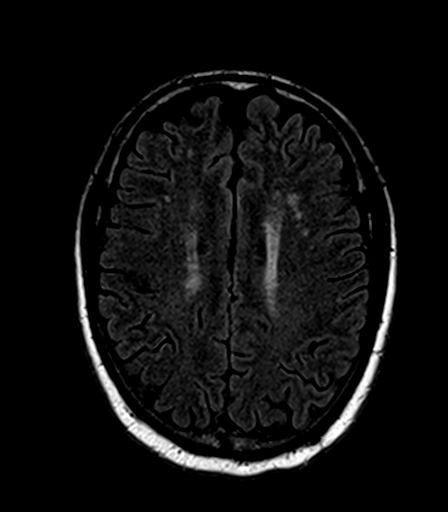
[im 53/53]
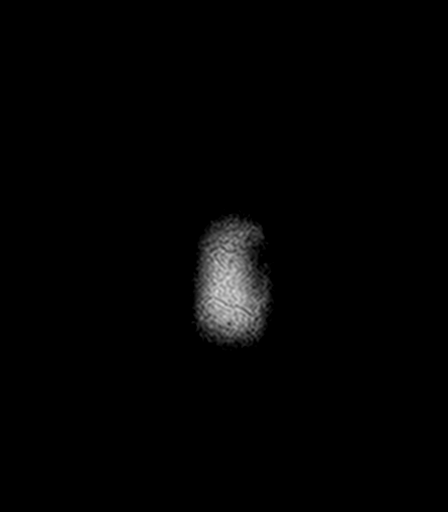

[Series 14: T2 · axial · 5.0mm · 0.45mm/px · z∈[-83,+78]mm · 2 of 26 slices shown (2 of 3)]
[im 1/26]
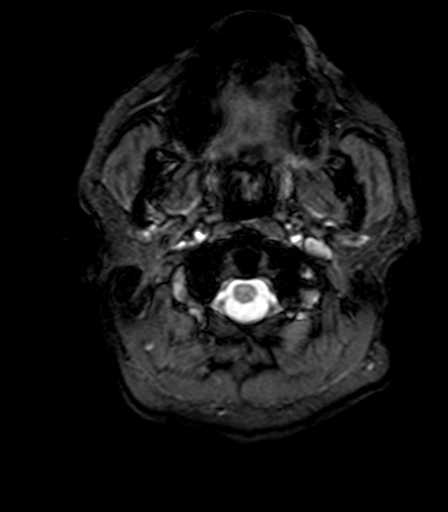
[im 26/26]
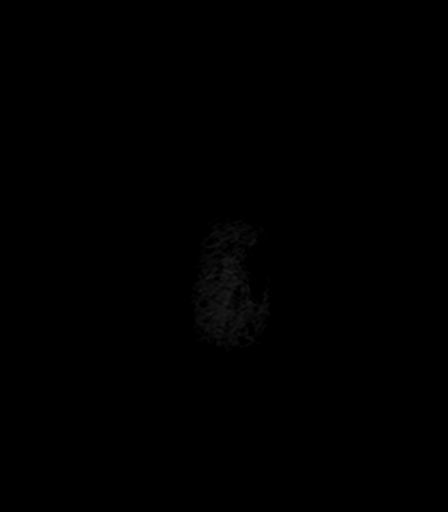

[Series 15: T1 · axial · 1.0mm · 1.00mm/px · z∈[-91,+83]mm · 8 of 176 slices shown (2 of 2)]
[im 1/176]
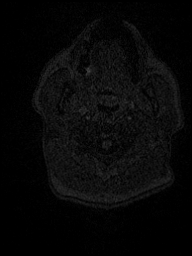
[im 32/176]
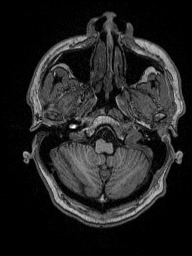
[im 48/176]
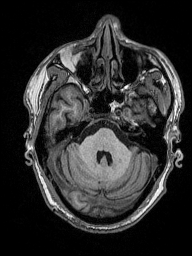
[im 80/176]
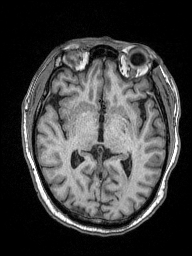
[im 96/176]
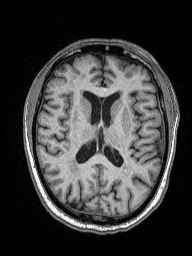
[im 128/176]
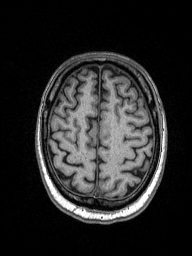
[im 144/176]
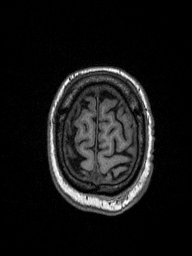
[im 176/176]
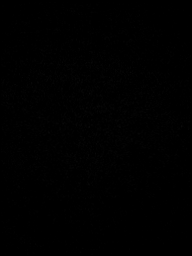

[Series 16: T2 · coronal · 5.0mm · 0.49mm/px · 2 of 29 slices shown (3 of 3)]
[im 1/29]
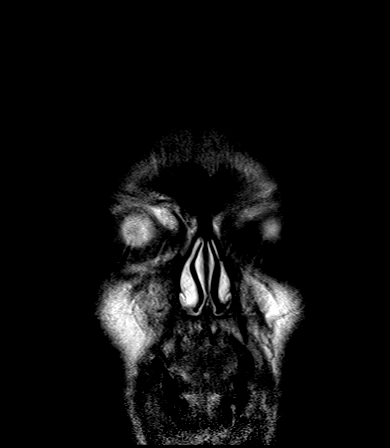
[im 29/29]
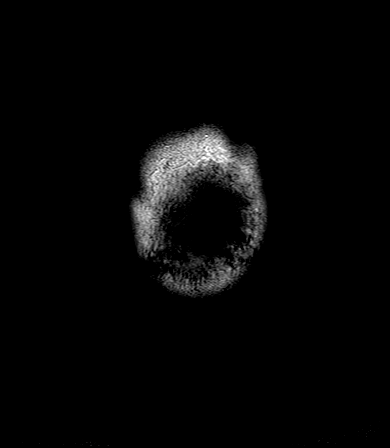

[34 of 48 positions shown; findings below may reference images not displayed]

FINDINGS: MRI HEAD FINDINGS

INTRACRANIAL CONTENTS: 5 mm reduced diffusion LEFT thalamus with low
ADC values. No susceptibility artifact to suggest hemorrhage. Mild
parenchymal brain volume loss. No hydrocephalus. Patchy
supratentorial white matter FLAIR T2 hyperintensities. No suspicious
parenchymal signal, masses, mass effect. No abnormal extra-axial
fluid collections. No extra-axial masses.

VASCULAR: Normal major intracranial vascular flow voids present at
skull base.

SKULL AND UPPER CERVICAL SPINE: No abnormal sellar expansion. No
suspicious calvarial bone marrow signal. Craniocervical junction
maintained. Small RIGHT frontal scalp lipoma.

SINUSES/ORBITS: The mastoid air-cells and included paranasal sinuses
are well-aerated.The included ocular globes and orbital contents are
non-suspicious.

OTHER: None.

MRA HEAD FINDINGS

ANTERIOR CIRCULATION: Normal flow related enhancement of the
included cervical, petrous, cavernous and supraclinoid internal
carotid arteries. Moderate stenosis bilateral supraclinoid ICA.
Patent anterior communicating artery. Patent anterior and middle
cerebral arteries. Severe stenosis LEFT M2 origin.

No large vessel occlusion, aneurysm.

POSTERIOR CIRCULATION: Codominant vertebral arteries.
Vertebrobasilar arteries are patent, with normal flow related
enhancement of the main branch vessels. Patent posterior cerebral
arteries, moderate to severe stenosis bilateral P2 segments. Tiny
out patchy RIGHT P2-3 junction.

No large vessel occlusion, flow limiting stenosis.

ANATOMIC VARIANTS: None.

Source images and MIP images were reviewed.
IMPRESSION: MRI HEAD:

1. 5 mm acute LEFT thalamus infarct.
2. Mild parenchymal brain volume loss.
3. Mild-to-moderate chronic small vessel ischemic changes.

MRA HEAD:

1. No emergent large vessel occlusion. Severe stenosis LEFT M2
origin.
2. Moderate to severe stenosis bilateral P2 segments. Tiny possible
RIGHT P2-3 junction aneurysm.
3. Moderate stenosis bilateral supraclinoid ICA.
4. For constellation of above findings, recommend CTA HEAD on non
emergent basis.

## 2019-04-05 ENCOUNTER — Other Ambulatory Visit: Payer: Self-pay | Admitting: Acute Care

## 2019-04-05 ENCOUNTER — Other Ambulatory Visit (HOSPITAL_COMMUNITY): Payer: Self-pay | Admitting: Acute Care

## 2019-04-05 DIAGNOSIS — I729 Aneurysm of unspecified site: Secondary | ICD-10-CM

## 2019-04-05 DIAGNOSIS — I771 Stricture of artery: Secondary | ICD-10-CM

## 2019-04-11 ENCOUNTER — Ambulatory Visit: Payer: PRIVATE HEALTH INSURANCE
